# Patient Record
Sex: Male | Born: 1995 | Race: White | Hispanic: No | Marital: Single | Smoking: Current every day smoker
Health system: Southern US, Community
[De-identification: ages and names within clinical notes are randomized; demographics above are authoritative.]

## PROBLEM LIST (undated history)

## (undated) DIAGNOSIS — F988 Other specified behavioral and emotional disorders with onset usually occurring in childhood and adolescence: Secondary | ICD-10-CM

## (undated) DIAGNOSIS — L309 Dermatitis, unspecified: Secondary | ICD-10-CM

## (undated) DIAGNOSIS — F909 Attention-deficit hyperactivity disorder, unspecified type: Secondary | ICD-10-CM

## (undated) DIAGNOSIS — T7840XA Allergy, unspecified, initial encounter: Secondary | ICD-10-CM

## (undated) DIAGNOSIS — F419 Anxiety disorder, unspecified: Secondary | ICD-10-CM

## (undated) HISTORY — DX: Dermatitis, unspecified: L30.9

## (undated) HISTORY — DX: Other specified behavioral and emotional disorders with onset usually occurring in childhood and adolescence: F98.8

## (undated) HISTORY — DX: Allergy, unspecified, initial encounter: T78.40XA

## (undated) HISTORY — PX: DENTAL SURGERY: SHX609

## (undated) HISTORY — DX: Anxiety disorder, unspecified: F41.9

## (undated) HISTORY — DX: Attention-deficit hyperactivity disorder, unspecified type: F90.9

---
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
## 2017-01-04 DIAGNOSIS — F1220 Cannabis dependence, uncomplicated: Secondary | ICD-10-CM | POA: Diagnosis not present

## 2017-01-04 DIAGNOSIS — F1020 Alcohol dependence, uncomplicated: Secondary | ICD-10-CM | POA: Diagnosis not present

## 2017-01-11 DIAGNOSIS — F1220 Cannabis dependence, uncomplicated: Secondary | ICD-10-CM | POA: Diagnosis not present

## 2017-02-15 IMAGING — DX DG CERVICAL SPINE COMPLETE 4+V
6 series · 6 of 6 positions shown · non-contrast
Comparison: None in PACs

CLINICAL DATA: Stabbing pain in the right posterior neck
intermittently; patient lifts weights common no discrete episode of
injury.

EXAM:
CERVICAL SPINE - COMPLETE 4+ VIEW

[c-spine lat]
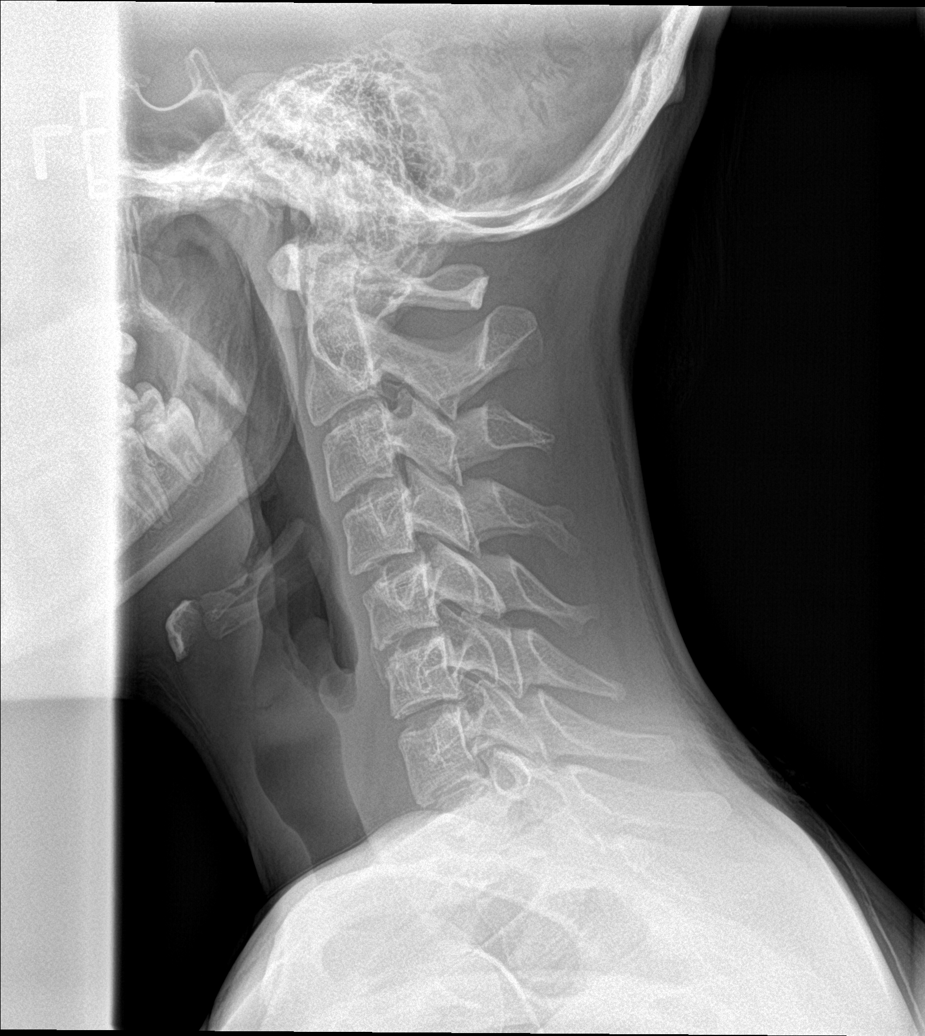

[c-spine obl (1 of 2)]
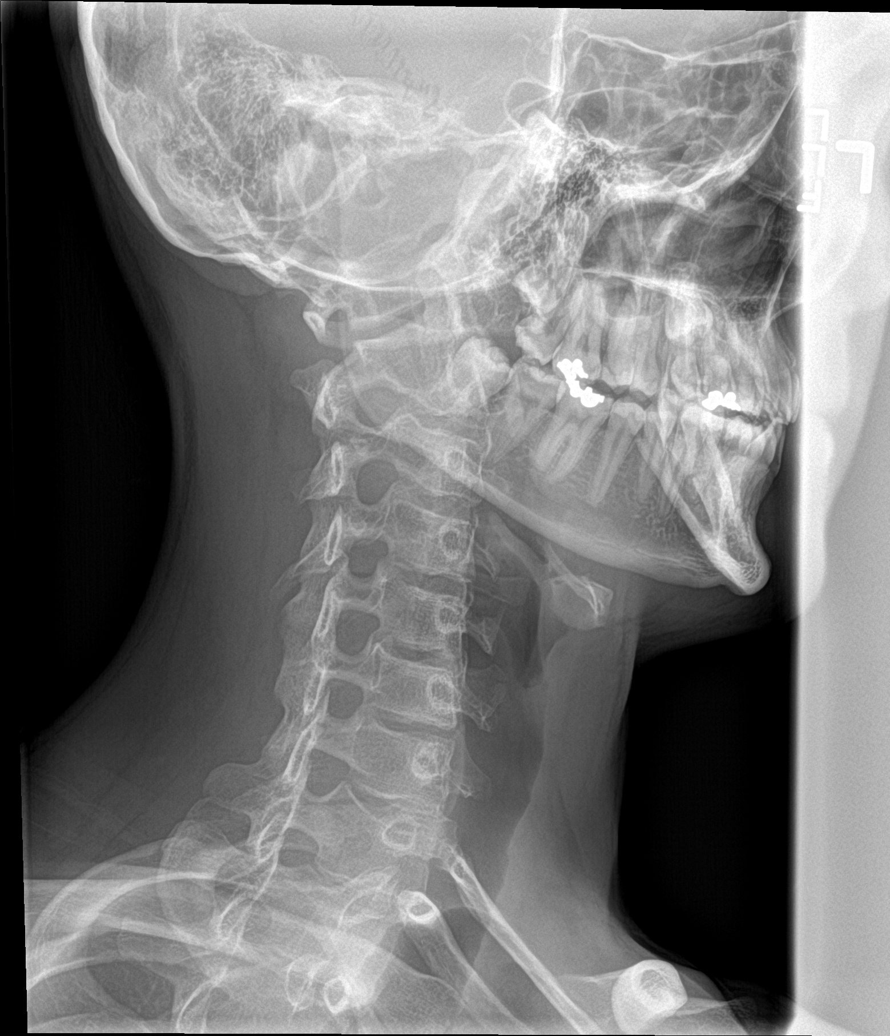

[c-spine obl (2 of 2)]
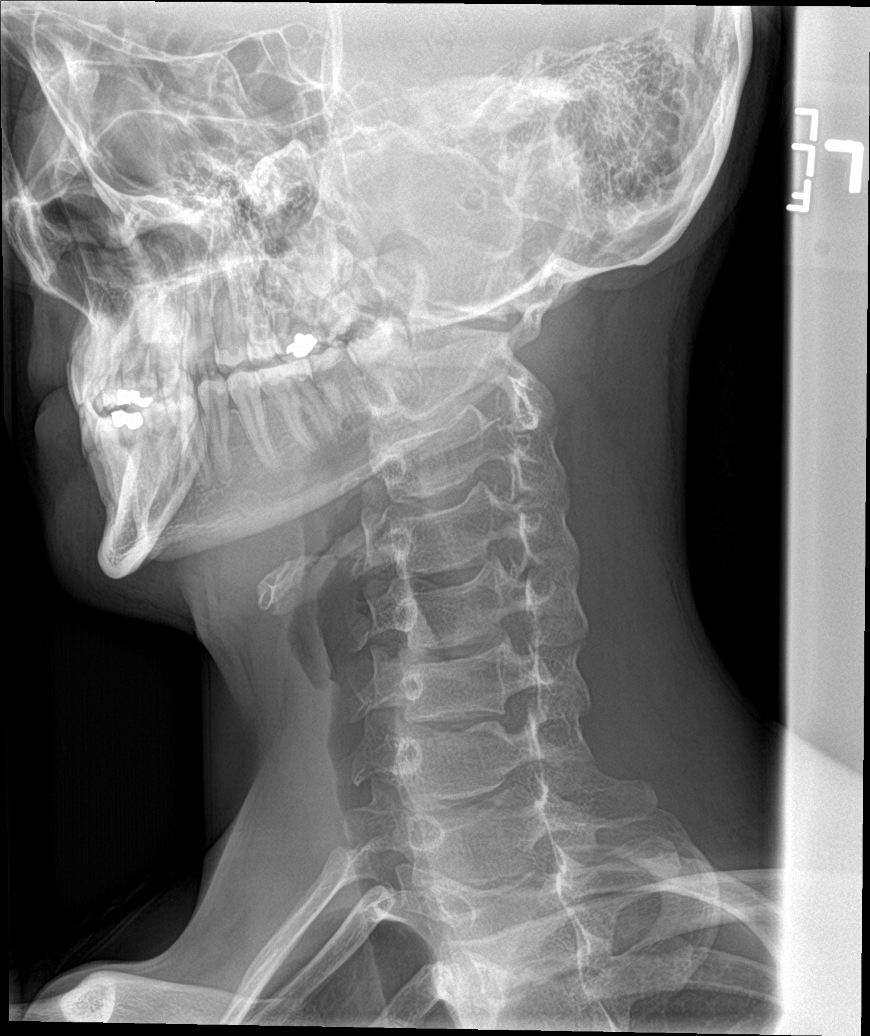

[c-spine ap]
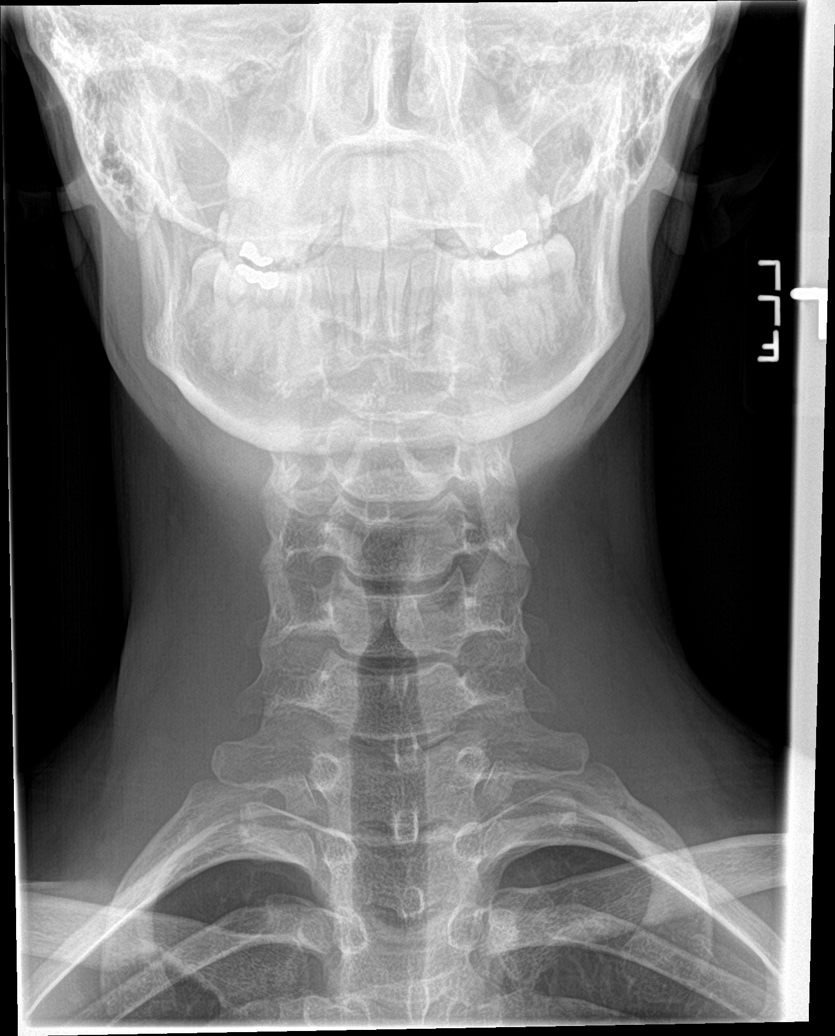

[c-spine open mouth]
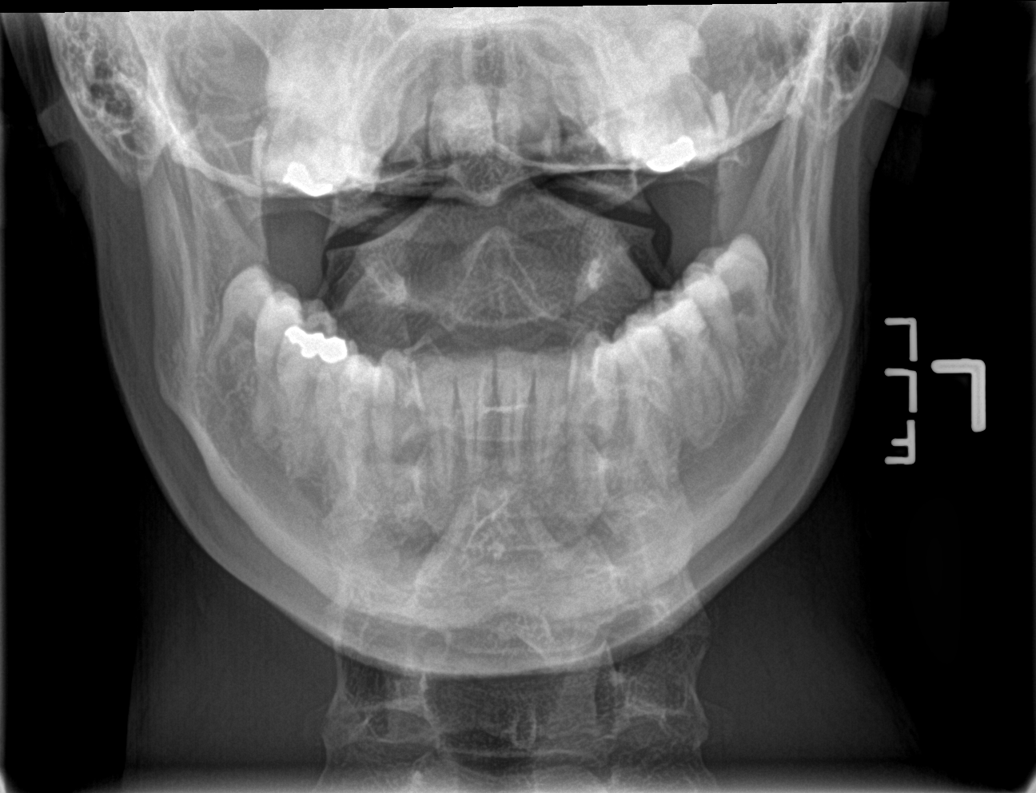

[[person_name]]
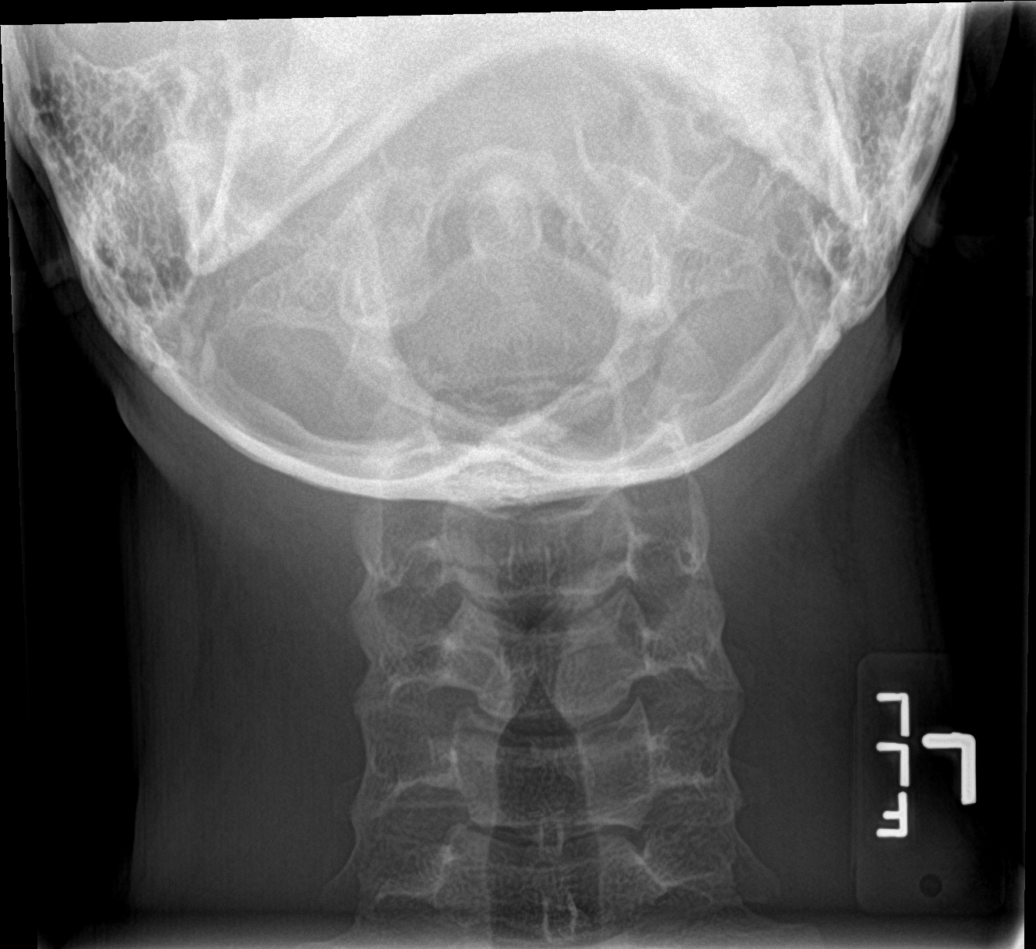

[6 of 6 positions shown; findings below may reference images not displayed]

FINDINGS: There is mild loss of the normal cervical lordosis. The cervical
vertebral bodies are preserved in height. There is minimal disc
space narrowing at C5-6. There is faint calcification in the
anterior longitudinal ligament at C5-6. There is no
spondylolisthesis. There is no perched facet or significant facet
joint hypertrophy. The spinous processes are intact. There is no
bony encroachment upon the neural foramina. The odontoid is intact.
The prevertebral soft tissue spaces are normal.
IMPRESSION: Mild degenerative disc disease centered at C5-6. There is no acute
bony abnormality.

## 2017-02-16 DIAGNOSIS — F1020 Alcohol dependence, uncomplicated: Secondary | ICD-10-CM | POA: Diagnosis not present

## 2017-02-17 DIAGNOSIS — F419 Anxiety disorder, unspecified: Secondary | ICD-10-CM | POA: Diagnosis not present

## 2017-02-17 DIAGNOSIS — F332 Major depressive disorder, recurrent severe without psychotic features: Secondary | ICD-10-CM | POA: Diagnosis not present

## 2017-02-17 DIAGNOSIS — F1410 Cocaine abuse, uncomplicated: Secondary | ICD-10-CM | POA: Diagnosis not present

## 2017-02-17 DIAGNOSIS — F1220 Cannabis dependence, uncomplicated: Secondary | ICD-10-CM | POA: Diagnosis not present

## 2017-02-17 DIAGNOSIS — F1020 Alcohol dependence, uncomplicated: Secondary | ICD-10-CM | POA: Diagnosis not present

## 2017-02-17 DIAGNOSIS — F909 Attention-deficit hyperactivity disorder, unspecified type: Secondary | ICD-10-CM | POA: Diagnosis not present

## 2017-02-18 DIAGNOSIS — F419 Anxiety disorder, unspecified: Secondary | ICD-10-CM | POA: Diagnosis not present

## 2017-02-18 DIAGNOSIS — F1410 Cocaine abuse, uncomplicated: Secondary | ICD-10-CM | POA: Diagnosis not present

## 2017-02-18 DIAGNOSIS — F332 Major depressive disorder, recurrent severe without psychotic features: Secondary | ICD-10-CM | POA: Diagnosis not present

## 2017-02-18 DIAGNOSIS — F1020 Alcohol dependence, uncomplicated: Secondary | ICD-10-CM | POA: Diagnosis not present

## 2017-02-18 DIAGNOSIS — F1220 Cannabis dependence, uncomplicated: Secondary | ICD-10-CM | POA: Diagnosis not present

## 2017-02-18 DIAGNOSIS — F909 Attention-deficit hyperactivity disorder, unspecified type: Secondary | ICD-10-CM | POA: Diagnosis not present

## 2017-02-19 DIAGNOSIS — F1020 Alcohol dependence, uncomplicated: Secondary | ICD-10-CM | POA: Diagnosis not present

## 2017-02-20 DIAGNOSIS — F1020 Alcohol dependence, uncomplicated: Secondary | ICD-10-CM | POA: Diagnosis not present

## 2017-02-21 DIAGNOSIS — F1020 Alcohol dependence, uncomplicated: Secondary | ICD-10-CM | POA: Diagnosis not present

## 2017-02-22 DIAGNOSIS — F1020 Alcohol dependence, uncomplicated: Secondary | ICD-10-CM | POA: Diagnosis not present

## 2017-02-22 DIAGNOSIS — F332 Major depressive disorder, recurrent severe without psychotic features: Secondary | ICD-10-CM | POA: Diagnosis not present

## 2017-02-22 DIAGNOSIS — F419 Anxiety disorder, unspecified: Secondary | ICD-10-CM | POA: Diagnosis not present

## 2017-02-22 DIAGNOSIS — F1220 Cannabis dependence, uncomplicated: Secondary | ICD-10-CM | POA: Diagnosis not present

## 2017-02-22 DIAGNOSIS — F1410 Cocaine abuse, uncomplicated: Secondary | ICD-10-CM | POA: Diagnosis not present

## 2017-02-22 DIAGNOSIS — F909 Attention-deficit hyperactivity disorder, unspecified type: Secondary | ICD-10-CM | POA: Diagnosis not present

## 2017-02-23 DIAGNOSIS — F1020 Alcohol dependence, uncomplicated: Secondary | ICD-10-CM | POA: Diagnosis not present

## 2017-02-24 DIAGNOSIS — F1020 Alcohol dependence, uncomplicated: Secondary | ICD-10-CM | POA: Diagnosis not present

## 2017-02-25 DIAGNOSIS — F1020 Alcohol dependence, uncomplicated: Secondary | ICD-10-CM | POA: Diagnosis not present

## 2017-02-26 DIAGNOSIS — F1020 Alcohol dependence, uncomplicated: Secondary | ICD-10-CM | POA: Diagnosis not present

## 2017-02-27 DIAGNOSIS — F1020 Alcohol dependence, uncomplicated: Secondary | ICD-10-CM | POA: Diagnosis not present

## 2017-02-28 DIAGNOSIS — F1410 Cocaine abuse, uncomplicated: Secondary | ICD-10-CM | POA: Diagnosis not present

## 2017-02-28 DIAGNOSIS — F909 Attention-deficit hyperactivity disorder, unspecified type: Secondary | ICD-10-CM | POA: Diagnosis not present

## 2017-02-28 DIAGNOSIS — F1220 Cannabis dependence, uncomplicated: Secondary | ICD-10-CM | POA: Diagnosis not present

## 2017-02-28 DIAGNOSIS — F419 Anxiety disorder, unspecified: Secondary | ICD-10-CM | POA: Diagnosis not present

## 2017-02-28 DIAGNOSIS — F1020 Alcohol dependence, uncomplicated: Secondary | ICD-10-CM | POA: Diagnosis not present

## 2017-02-28 DIAGNOSIS — F332 Major depressive disorder, recurrent severe without psychotic features: Secondary | ICD-10-CM | POA: Diagnosis not present

## 2017-03-01 DIAGNOSIS — F1020 Alcohol dependence, uncomplicated: Secondary | ICD-10-CM | POA: Diagnosis not present

## 2017-03-02 DIAGNOSIS — F1020 Alcohol dependence, uncomplicated: Secondary | ICD-10-CM | POA: Diagnosis not present

## 2017-03-03 DIAGNOSIS — F1220 Cannabis dependence, uncomplicated: Secondary | ICD-10-CM | POA: Diagnosis not present

## 2017-03-03 DIAGNOSIS — F419 Anxiety disorder, unspecified: Secondary | ICD-10-CM | POA: Diagnosis not present

## 2017-03-03 DIAGNOSIS — F909 Attention-deficit hyperactivity disorder, unspecified type: Secondary | ICD-10-CM | POA: Diagnosis not present

## 2017-03-03 DIAGNOSIS — F1410 Cocaine abuse, uncomplicated: Secondary | ICD-10-CM | POA: Diagnosis not present

## 2017-03-03 DIAGNOSIS — F332 Major depressive disorder, recurrent severe without psychotic features: Secondary | ICD-10-CM | POA: Diagnosis not present

## 2017-03-03 DIAGNOSIS — F1020 Alcohol dependence, uncomplicated: Secondary | ICD-10-CM | POA: Diagnosis not present

## 2017-03-04 DIAGNOSIS — F1020 Alcohol dependence, uncomplicated: Secondary | ICD-10-CM | POA: Diagnosis not present

## 2017-03-05 DIAGNOSIS — F1020 Alcohol dependence, uncomplicated: Secondary | ICD-10-CM | POA: Diagnosis not present

## 2017-03-06 DIAGNOSIS — F1020 Alcohol dependence, uncomplicated: Secondary | ICD-10-CM | POA: Diagnosis not present

## 2017-03-07 DIAGNOSIS — F1020 Alcohol dependence, uncomplicated: Secondary | ICD-10-CM | POA: Diagnosis not present

## 2017-03-07 DIAGNOSIS — F1410 Cocaine abuse, uncomplicated: Secondary | ICD-10-CM | POA: Diagnosis not present

## 2017-03-07 DIAGNOSIS — F1220 Cannabis dependence, uncomplicated: Secondary | ICD-10-CM | POA: Diagnosis not present

## 2017-03-07 DIAGNOSIS — F909 Attention-deficit hyperactivity disorder, unspecified type: Secondary | ICD-10-CM | POA: Diagnosis not present

## 2017-03-07 DIAGNOSIS — F419 Anxiety disorder, unspecified: Secondary | ICD-10-CM | POA: Diagnosis not present

## 2017-03-07 DIAGNOSIS — F332 Major depressive disorder, recurrent severe without psychotic features: Secondary | ICD-10-CM | POA: Diagnosis not present

## 2017-03-08 DIAGNOSIS — F1020 Alcohol dependence, uncomplicated: Secondary | ICD-10-CM | POA: Diagnosis not present

## 2017-03-09 DIAGNOSIS — F1020 Alcohol dependence, uncomplicated: Secondary | ICD-10-CM | POA: Diagnosis not present

## 2017-03-10 DIAGNOSIS — F1020 Alcohol dependence, uncomplicated: Secondary | ICD-10-CM | POA: Diagnosis not present

## 2017-03-11 DIAGNOSIS — F1020 Alcohol dependence, uncomplicated: Secondary | ICD-10-CM | POA: Diagnosis not present

## 2017-03-12 DIAGNOSIS — F1020 Alcohol dependence, uncomplicated: Secondary | ICD-10-CM | POA: Diagnosis not present

## 2017-03-13 DIAGNOSIS — F1020 Alcohol dependence, uncomplicated: Secondary | ICD-10-CM | POA: Diagnosis not present

## 2017-03-14 DIAGNOSIS — F909 Attention-deficit hyperactivity disorder, unspecified type: Secondary | ICD-10-CM | POA: Diagnosis not present

## 2017-03-14 DIAGNOSIS — F1410 Cocaine abuse, uncomplicated: Secondary | ICD-10-CM | POA: Diagnosis not present

## 2017-03-14 DIAGNOSIS — F1220 Cannabis dependence, uncomplicated: Secondary | ICD-10-CM | POA: Diagnosis not present

## 2017-03-14 DIAGNOSIS — F1020 Alcohol dependence, uncomplicated: Secondary | ICD-10-CM | POA: Diagnosis not present

## 2017-03-14 DIAGNOSIS — F419 Anxiety disorder, unspecified: Secondary | ICD-10-CM | POA: Diagnosis not present

## 2017-03-14 DIAGNOSIS — F332 Major depressive disorder, recurrent severe without psychotic features: Secondary | ICD-10-CM | POA: Diagnosis not present

## 2017-03-15 DIAGNOSIS — F1020 Alcohol dependence, uncomplicated: Secondary | ICD-10-CM | POA: Diagnosis not present

## 2017-03-15 DIAGNOSIS — F332 Major depressive disorder, recurrent severe without psychotic features: Secondary | ICD-10-CM | POA: Diagnosis not present

## 2017-03-15 DIAGNOSIS — F1410 Cocaine abuse, uncomplicated: Secondary | ICD-10-CM | POA: Diagnosis not present

## 2017-03-15 DIAGNOSIS — F419 Anxiety disorder, unspecified: Secondary | ICD-10-CM | POA: Diagnosis not present

## 2017-03-15 DIAGNOSIS — F909 Attention-deficit hyperactivity disorder, unspecified type: Secondary | ICD-10-CM | POA: Diagnosis not present

## 2017-03-15 DIAGNOSIS — F1220 Cannabis dependence, uncomplicated: Secondary | ICD-10-CM | POA: Diagnosis not present

## 2017-03-17 DIAGNOSIS — F1020 Alcohol dependence, uncomplicated: Secondary | ICD-10-CM | POA: Diagnosis not present

## 2017-03-21 DIAGNOSIS — F1020 Alcohol dependence, uncomplicated: Secondary | ICD-10-CM | POA: Diagnosis not present

## 2017-03-22 DIAGNOSIS — F1020 Alcohol dependence, uncomplicated: Secondary | ICD-10-CM | POA: Diagnosis not present

## 2017-03-28 DIAGNOSIS — F1020 Alcohol dependence, uncomplicated: Secondary | ICD-10-CM | POA: Diagnosis not present

## 2017-03-29 DIAGNOSIS — F1020 Alcohol dependence, uncomplicated: Secondary | ICD-10-CM | POA: Diagnosis not present

## 2017-03-31 DIAGNOSIS — F1020 Alcohol dependence, uncomplicated: Secondary | ICD-10-CM | POA: Diagnosis not present

## 2017-04-04 DIAGNOSIS — F1020 Alcohol dependence, uncomplicated: Secondary | ICD-10-CM | POA: Diagnosis not present

## 2017-04-05 DIAGNOSIS — F1020 Alcohol dependence, uncomplicated: Secondary | ICD-10-CM | POA: Diagnosis not present

## 2017-04-07 DIAGNOSIS — F1020 Alcohol dependence, uncomplicated: Secondary | ICD-10-CM | POA: Diagnosis not present

## 2017-04-12 DIAGNOSIS — F1020 Alcohol dependence, uncomplicated: Secondary | ICD-10-CM | POA: Diagnosis not present

## 2017-04-14 DIAGNOSIS — F1020 Alcohol dependence, uncomplicated: Secondary | ICD-10-CM | POA: Diagnosis not present

## 2017-04-18 DIAGNOSIS — F1020 Alcohol dependence, uncomplicated: Secondary | ICD-10-CM | POA: Diagnosis not present

## 2017-04-19 DIAGNOSIS — F1020 Alcohol dependence, uncomplicated: Secondary | ICD-10-CM | POA: Diagnosis not present

## 2017-04-21 DIAGNOSIS — F1410 Cocaine abuse, uncomplicated: Secondary | ICD-10-CM | POA: Diagnosis not present

## 2017-04-21 DIAGNOSIS — F1020 Alcohol dependence, uncomplicated: Secondary | ICD-10-CM | POA: Diagnosis not present

## 2017-04-21 DIAGNOSIS — F332 Major depressive disorder, recurrent severe without psychotic features: Secondary | ICD-10-CM | POA: Diagnosis not present

## 2017-04-21 DIAGNOSIS — F909 Attention-deficit hyperactivity disorder, unspecified type: Secondary | ICD-10-CM | POA: Diagnosis not present

## 2017-04-21 DIAGNOSIS — F1220 Cannabis dependence, uncomplicated: Secondary | ICD-10-CM | POA: Diagnosis not present

## 2017-04-21 DIAGNOSIS — F419 Anxiety disorder, unspecified: Secondary | ICD-10-CM | POA: Diagnosis not present

## 2017-04-28 DIAGNOSIS — F1020 Alcohol dependence, uncomplicated: Secondary | ICD-10-CM | POA: Diagnosis not present

## 2017-05-02 DIAGNOSIS — F1020 Alcohol dependence, uncomplicated: Secondary | ICD-10-CM | POA: Diagnosis not present

## 2017-05-03 DIAGNOSIS — F1020 Alcohol dependence, uncomplicated: Secondary | ICD-10-CM | POA: Diagnosis not present

## 2017-05-05 DIAGNOSIS — F1020 Alcohol dependence, uncomplicated: Secondary | ICD-10-CM | POA: Diagnosis not present

## 2017-05-09 DIAGNOSIS — F1020 Alcohol dependence, uncomplicated: Secondary | ICD-10-CM | POA: Diagnosis not present

## 2017-05-10 DIAGNOSIS — F1020 Alcohol dependence, uncomplicated: Secondary | ICD-10-CM | POA: Diagnosis not present

## 2017-05-24 ENCOUNTER — Encounter: Payer: Self-pay | Admitting: Family Medicine

## 2017-05-24 ENCOUNTER — Ambulatory Visit (INDEPENDENT_AMBULATORY_CARE_PROVIDER_SITE_OTHER): Payer: 59 | Admitting: Family Medicine

## 2017-05-24 VITALS — BP 118/72 | Ht 65.0 in | Wt 127.0 lb

## 2017-05-24 DIAGNOSIS — F329 Major depressive disorder, single episode, unspecified: Secondary | ICD-10-CM

## 2017-05-24 DIAGNOSIS — F1910 Other psychoactive substance abuse, uncomplicated: Secondary | ICD-10-CM | POA: Diagnosis not present

## 2017-05-24 DIAGNOSIS — F32A Depression, unspecified: Secondary | ICD-10-CM | POA: Insufficient documentation

## 2017-05-24 DIAGNOSIS — F419 Anxiety disorder, unspecified: Secondary | ICD-10-CM | POA: Diagnosis not present

## 2017-05-24 MED ORDER — HYDROXYZINE HCL 25 MG PO TABS: ORAL_TABLET | ORAL | 2 refills | Status: AC

## 2017-05-24 MED ORDER — TRAZODONE HCL 100 MG PO TABS: 100.0000 mg | ORAL_TABLET | Freq: Every day | ORAL | 2 refills | Status: DC

## 2017-05-24 MED ORDER — SERTRALINE HCL 100 MG PO TABS: 100.0000 mg | ORAL_TABLET | Freq: Every day | ORAL | 2 refills | Status: DC

## 2017-05-24 NOTE — Patient Instructions (Signed)
If struggling or feeling suicidal please follow up ASAP as we agreed  I will help set up a counselor  Also recheck here in 2 months

## 2017-05-24 NOTE — Progress Notes (Addendum)
   Subjective:    Patient ID: Michael Gray, male    DOB: 14-Dec-1995, 22 y.o.   MRN: [id number]  HPI Patient arrives to discuss anxiety and depression med. Patient is no longer seeing the psychologist and needs to have meds taken over here Patient does admit weeks ago he did feel like he was going to hurt himself but then he went through treatment after treatment he feels much better He does struggle some with anxiety and depression but he is trying to surround himself with better people and make better choices he does work full-time does not drive currently because he had a DUI His mom is here with him today He denies being suicidal.  He states he does not have access to a gun Review of Systems Denies chest tightness pressure pain shortness of breath denies high fever chills sweats    Objective:   Physical Exam  Lungs clear heart regular pulse normal vital signs stable      Assessment & Plan:  Substance abuse disorder-patient has gone through treatment [facility] at [facility] we have requested records await these  His underlying depression and anxiety doing well on sertraline and trazodone refills of this given may use hydroxyzine at nighttime to help with sleep if trazodone does not do enough  Patient was counseled that he ought to have a regular psychological counseling as well as have an accountability partner to stay away from alcohol he was also counseled total abstinence not to dabble with it but  Patient denied being suicidal.  Patient agrees in front of his mother that if he starts feeling suicidal or feeling like he is going to harm himself he will immediately seek help he will follow-up here immediately  Recheck patient in 2-3 months  I did inform the patient and the mother that sometimes medication can actually make depression worse and increased risk of being suicidal.  Although it is reasonable for him to be on medication I informed the patient that if he feels his  depression is worsening or if he feels suicidal he needs to immediately seek out help with us or psychologist/ER  Initially I had the patient coming back in 2 months but now I plan to bring him back in 1 month we will notify the patient

## 2017-05-25 NOTE — Addendum Note (Signed)
Addended by: Margaretha SheffieldBROWN, AUTUMN S on: 05/25/2017 08:30 AM   Modules accepted: Orders

## 2017-05-25 NOTE — Progress Notes (Signed)
I attempted to call Michael Gray, he answered and asked that I call him back after 3:00 because he is at work.

## 2017-05-25 NOTE — Progress Notes (Signed)
Referral ordered in Epic. 

## 2017-05-26 ENCOUNTER — Telehealth: Payer: Self-pay | Admitting: Family Medicine

## 2017-05-26 NOTE — Telephone Encounter (Signed)
[facility] - Need to change appt from March to February per Dr. Lorin PicketScott - see note below  Message  Received: 2 days ago  Message Contents  Luking, Jonna CoupScott A, MD  P [facility] Admin Pool        I would like for this patient to follow up in 1 month rather than 2 months-please contact him to move office visit to 4-5 weeks from now thanks

## 2017-05-27 ENCOUNTER — Encounter: Payer: Self-pay | Admitting: Family Medicine

## 2017-06-23 ENCOUNTER — Ambulatory Visit: Payer: 59 | Admitting: Family Medicine

## 2017-07-12 ENCOUNTER — Encounter: Payer: Self-pay | Admitting: Family Medicine

## 2017-07-26 ENCOUNTER — Ambulatory Visit: Payer: 59 | Admitting: Family Medicine

## 2017-07-27 ENCOUNTER — Ambulatory Visit: Payer: 59 | Admitting: Family Medicine

## 2017-08-02 ENCOUNTER — Ambulatory Visit (INDEPENDENT_AMBULATORY_CARE_PROVIDER_SITE_OTHER): Payer: 59 | Admitting: Family Medicine

## 2017-08-02 ENCOUNTER — Encounter: Payer: Self-pay | Admitting: Family Medicine

## 2017-08-02 VITALS — BP 118/78 | Ht 65.0 in | Wt 125.0 lb

## 2017-08-02 DIAGNOSIS — G4700 Insomnia, unspecified: Secondary | ICD-10-CM

## 2017-08-02 DIAGNOSIS — F439 Reaction to severe stress, unspecified: Secondary | ICD-10-CM

## 2017-08-02 MED ORDER — TRAZODONE HCL 100 MG PO TABS: ORAL_TABLET | ORAL | 1 refills | Status: DC

## 2017-08-02 NOTE — Progress Notes (Signed)
   Subjective:    Patient ID: Michael Gray, male    DOB: [birth year], 22 y.o.   MRN: [id number]  HPI Patient is here today to follow up on Anxiety. He state he thinks it is better to some extent. He states he takes his zoloft sporadically.States he does take the trazodone regularly.  Patient states he is working gets along with coworkers denies any problems denies any fights denies drug use states only occasionally using alcohol keeping it under control patient understands that he should avoid alcohol altogether patient denies being depressed denies being suicidal homicidal  Review of Systems  Constitutional: Negative for activity change.  HENT: Negative for congestion and rhinorrhea.   Respiratory: Negative for cough and shortness of breath.   Cardiovascular: Negative for chest pain.  Gastrointestinal: Negative for abdominal pain, diarrhea, nausea and vomiting.  Genitourinary: Negative for dysuria and hematuria.  Neurological: Negative for weakness and headaches.  Psychiatric/Behavioral: Negative for confusion.       Objective:   Physical Exam  Constitutional: He appears well-nourished.  Cardiovascular: Normal rate, regular rhythm and normal heart sounds.  No murmur heard. Pulmonary/Chest: Effort normal and breath sounds normal.  Musculoskeletal: He exhibits no edema.  Lymphadenopathy:    He has no cervical adenopathy.  Neurological: He is alert.  Psychiatric: His behavior is normal.  Vitals reviewed.  Patient states he hopes to get his driver's license back by January 2020       Assessment & Plan:  I do not feel it is beneficial for the patient to take Zoloft any longer I told him to stop this medicine he may continue to use trazodone to help with sleep at night he states it helps some but he is requesting a higher dose we will increase it to 1-1/2 tablets each evening he is to follow-up if progressive troubles  Recheck the patient in 6 months

## 2017-12-05 DIAGNOSIS — S62356D Nondisplaced fracture of shaft of fifth metacarpal bone, right hand, subsequent encounter for fracture with routine healing: Secondary | ICD-10-CM | POA: Diagnosis not present

## 2017-12-05 DIAGNOSIS — S62306A Unspecified fracture of fifth metacarpal bone, right hand, initial encounter for closed fracture: Secondary | ICD-10-CM | POA: Diagnosis not present

## 2017-12-19 DIAGNOSIS — S62356D Nondisplaced fracture of shaft of fifth metacarpal bone, right hand, subsequent encounter for fracture with routine healing: Secondary | ICD-10-CM | POA: Diagnosis not present

## 2018-01-16 DIAGNOSIS — S62356D Nondisplaced fracture of shaft of fifth metacarpal bone, right hand, subsequent encounter for fracture with routine healing: Secondary | ICD-10-CM | POA: Diagnosis not present

## 2018-06-15 ENCOUNTER — Encounter: Payer: Self-pay | Admitting: Family Medicine

## 2018-06-15 ENCOUNTER — Ambulatory Visit (INDEPENDENT_AMBULATORY_CARE_PROVIDER_SITE_OTHER): Payer: 59 | Admitting: Family Medicine

## 2018-06-15 VITALS — Temp 99.1°F | Wt 127.0 lb

## 2018-06-15 DIAGNOSIS — H65111 Acute and subacute allergic otitis media (mucoid) (sanguinous) (serous), right ear: Secondary | ICD-10-CM

## 2018-06-15 DIAGNOSIS — J189 Pneumonia, unspecified organism: Secondary | ICD-10-CM

## 2018-06-15 DIAGNOSIS — J181 Lobar pneumonia, unspecified organism: Secondary | ICD-10-CM

## 2018-06-15 DIAGNOSIS — J111 Influenza due to unidentified influenza virus with other respiratory manifestations: Secondary | ICD-10-CM

## 2018-06-15 MED ORDER — AZITHROMYCIN 250 MG PO TABS: ORAL_TABLET | ORAL | 0 refills | Status: DC

## 2018-06-15 NOTE — Progress Notes (Signed)
   Subjective:    Patient ID: Michael Gray, male    DOB: 03-10-1996, 23 y.o.   MRN: [id number]  Fever   This is a new problem. The current episode started in the past 7 days. The maximum temperature noted was 100 to 100.9 F. Associated symptoms include congestion, coughing, a sore throat and wheezing. Pertinent negatives include no chest pain, ear pain, nausea or vomiting. Associated symptoms comments: Runny nose. Treatments tried: mucinex. The treatment provided mild relief.   Patient with significant coughing congestion denies  wheezing difficulty breathing Patient was some fever over the past few days and sweats at nighttime relates low energy   Review of Systems  Constitutional: Positive for fever. Negative for activity change and chills.  HENT: Positive for congestion, rhinorrhea and sore throat. Negative for ear pain.   Eyes: Negative for discharge.  Respiratory: Positive for cough and wheezing.   Cardiovascular: Negative for chest pain.  Gastrointestinal: Negative for nausea and vomiting.  Musculoskeletal: Negative for arthralgias.       Objective:   Physical Exam Vitals signs and nursing note reviewed.  Constitutional:      Appearance: He is well-developed.  HENT:     Head: Normocephalic.     Mouth/Throat:     Pharynx: No oropharyngeal exudate.  Neck:     Musculoskeletal: Normal range of motion.  Cardiovascular:     Rate and Rhythm: Normal rate and regular rhythm.     Heart sounds: Normal heart sounds. No murmur.  Pulmonary:     Effort: Pulmonary effort is normal.     Breath sounds: No wheezing.  Lymphadenopathy:     Cervical: No cervical adenopathy.  Skin:    General: Skin is warm and dry.  Neurological:     Motor: No abnormal muscle tone.   Crackles in the left base but not respiratory distress Early right otitis media       Assessment & Plan:  Viral syndrome Flu He is past the point where Tamiflu would help Z-Pak for early pneumonia Early right  otitis media Should gradually get better over the next several days Warning signs discussed To lay low over the next few days Encourage patient to quit smoking

## 2018-06-15 NOTE — Patient Instructions (Signed)
Steps to Quit Smoking  Smoking tobacco can be bad for your health. It can also affect almost every organ in your body. Smoking puts you and people around you at risk for many serious long-lasting (chronic) diseases. Quitting smoking is hard, but it is one of the best things that you can do for your health. It is never too late to quit. What are the benefits of quitting smoking? When you quit smoking, you lower your risk for getting serious diseases and conditions. They can include:  Lung cancer or lung disease.  Heart disease.  Stroke.  Heart attack.  Not being able to have children (infertility).  Weak bones (osteoporosis) and broken bones (fractures). If you have coughing, wheezing, and shortness of breath, those symptoms may get better when you quit. You may also get sick less often. If you are pregnant, quitting smoking can help to lower your chances of having a baby of low birth weight. What can I do to help me quit smoking? Talk with your doctor about what can help you quit smoking. Some things you can do (strategies) include:  Quitting smoking totally, instead of slowly cutting back how much you smoke over a period of time.  Going to in-person counseling. You are more likely to quit if you go to many counseling sessions.  Using resources and support systems, such as: ? Online chats with a counselor. ? Phone quitlines. ? Printed self-help materials. ? Support groups or group counseling. ? Text messaging programs. ? Mobile phone apps or applications.  Taking medicines. Some of these medicines may have nicotine in them. If you are pregnant or breastfeeding, do not take any medicines to quit smoking unless your doctor says it is okay. Talk with your doctor about counseling or other things that can help you. Talk with your doctor about using more than one strategy at the same time, such as taking medicines while you are also going to in-person counseling. This can help make  quitting easier. What things can I do to make it easier to quit? Quitting smoking might feel very hard at first, but there is a lot that you can do to make it easier. Take these steps:  Talk to your family and friends. Ask them to support and encourage you.  Call phone quitlines, reach out to support groups, or work with a counselor.  Ask people who smoke to not smoke around you.  Avoid places that make you want (trigger) to smoke, such as: ? Bars. ? Parties. ? Smoke-break areas at work.  Spend time with people who do not smoke.  Lower the stress in your life. Stress can make you want to smoke. Try these things to help your stress: ? Getting regular exercise. ? Deep-breathing exercises. ? Yoga. ? Meditating. ? Doing a body scan. To do this, close your eyes, focus on one area of your body at a time from head to toe, and notice which parts of your body are tense. Try to relax the muscles in those areas.  Download or buy apps on your mobile phone or tablet that can help you stick to your quit plan. There are many free apps, such as QuitGuide from the CDC (Centers for Disease Control and Prevention). You can find more support from smokefree.gov and other websites. This information is not intended to replace advice given to you by your health care provider. Make sure you discuss any questions you have with your health care provider. Document Released: 02/13/2009 Document Revised: 12/16/2015   Document Reviewed: 09/03/2014 Elsevier Interactive Patient Education  2019 Elsevier Inc. Community-Acquired Pneumonia, Adult Pneumonia is an infection of the lungs. It causes swelling in the airways of the lungs. Mucus and fluid may also build up inside the airways. One type of pneumonia can happen while a person is in a hospital. A different type can happen when a person is not in a hospital (community-acquired pneumonia).  What are the causes?  This condition is caused by germs (viruses, bacteria,  or fungi). Some types of germs can be passed from one person to another. This can happen when you breathe in droplets from the cough or sneeze of an infected person. What increases the risk? You are more likely to develop this condition if you:  Have a long-term (chronic) disease, such as: ? Chronic obstructive pulmonary disease (COPD). ? Asthma. ? Cystic fibrosis. ? Congestive heart failure. ? Diabetes. ? Kidney disease.  Have HIV.  Have sickle cell disease.  Have had your spleen removed.  Do not take good care of your teeth and mouth (poor dental hygiene).  Have a medical condition that increases the risk of breathing in droplets from your own mouth and nose.  Have a weakened body defense system (immune system).  Are a smoker.  Travel to areas where the germs that cause this illness are common.  Are around certain animals or the places they live. What are the signs or symptoms?  A dry cough.  A wet (productive) cough.  Fever.  Sweating.  Chest pain. This often happens when breathing deeply or coughing.  Fast breathing or trouble breathing.  Shortness of breath.  Shaking chills.  Feeling tired (fatigue).  Muscle aches. How is this treated? Treatment for this condition depends on many things. Most adults can be treated at home. In some cases, treatment must happen in a hospital. Treatment may include:  Medicines given by mouth or through an IV tube.  Being given extra oxygen.  Respiratory therapy. In rare cases, treatment for very bad pneumonia may include:  Using a machine to help you breathe.  Having a procedure to remove fluid from around your lungs. Follow these instructions at home: Medicines  Take over-the-counter and prescription medicines only as told by your doctor. ? Only take cough medicine if you are losing sleep.  If you were prescribed an antibiotic medicine, take it as told by your doctor. Do not stop taking the antibiotic even if  you start to feel better. General instructions   Sleep with your head and neck raised (elevated). You can do this by sleeping in a recliner or by putting a few pillows under your head.  Rest as needed. Get at least 8 hours of sleep each night.  Drink enough water to keep your pee (urine) pale yellow.  Eat a healthy diet that includes plenty of vegetables, fruits, whole grains, low-fat dairy products, and lean protein.  Do not use any products that contain nicotine or tobacco. These include cigarettes, e-cigarettes, and chewing tobacco. If you need help quitting, ask your doctor.  Keep all follow-up visits as told by your doctor. This is important. How is this prevented? A shot (vaccine) can help prevent pneumonia. Shots are often suggested for:  People older than 21 years of age.  People older than 21 years of age who: ? Are having cancer treatment. ? Have long-term (chronic) lung disease. ? Have problems with their body's defense system. You may also prevent pneumonia if you take these actions:  Get  the flu (influenza) shot every year.  Go to the dentist as often as told.  Wash your hands often. If you cannot use soap and water, use hand sanitizer. Contact a doctor if:  You have a fever.  You lose sleep because your cough medicine does not help. Get help right away if:  You are short of breath and it gets worse.  You have more chest pain.  Your sickness gets worse. This is very serious if: ? You are an older adult. ? Your body's defense system is weak.  You cough up blood. Summary  Pneumonia is an infection of the lungs.  Most adults can be treated at home. Some will need treatment in a hospital.  Drink enough water to keep your pee pale yellow.  Get at least 8 hours of sleep each night. This information is not intended to replace advice given to you by your health care provider. Make sure you discuss any questions you have with your health care  provider. Document Released: 10/06/2007 Document Revised: 12/15/2017 Document Reviewed: 12/15/2017 Elsevier Interactive Patient Education  2019 Elsevier Inc. Influenza, Adult Influenza is also called "the flu." It is an infection in the lungs, nose, and throat (respiratory tract). It is caused by a virus. The flu causes symptoms that are similar to symptoms of a cold. It also causes a high fever and body aches. The flu spreads easily from person to person (is contagious). Getting a flu shot (influenza vaccination) every year is the best way to prevent the flu. What are the causes? This condition is caused by the influenza virus. You can get the virus by:  Breathing in droplets that are in the air from the cough or sneeze of a person who has the virus.  Touching something that has the virus on it (is contaminated) and then touching your mouth, nose, or eyes. What increases the risk? Certain things may make you more likely to get the flu. These include:  Not washing your hands often.  Having close contact with many people during cold and flu season.  Touching your mouth, eyes, or nose without first washing your hands.  Not getting a flu shot every year. You may have a higher risk for the flu, along with serious problems such as a lung infection (pneumonia), if you:  Are older than 65.  Are pregnant.  Have a weakened disease-fighting system (immune system) because of a disease or taking certain medicines.  Have a long-term (chronic) illness, such as: ? Heart, kidney, or lung disease. ? Diabetes. ? Asthma.  Have a liver disorder.  Are very overweight (morbidly obese).  Have anemia. This is a condition that affects your red blood cells. What are the signs or symptoms? Symptoms usually begin suddenly and last 4-14 days. They may include:  Fever and chills.  Headaches, body aches, or muscle aches.  Sore throat.  Cough.  Runny or stuffy (congested) nose.  Chest  discomfort.  Not wanting to eat as much as normal (poor appetite).  Weakness or feeling tired (fatigue).  Dizziness.  Feeling sick to your stomach (nauseous) or throwing up (vomiting). How is this treated? If the flu is found early, you can be treated with medicine that can help reduce how bad the illness is and how long it lasts (antiviral medicine). This may be given by mouth (orally) or through an IV tube. Taking care of yourself at home can help your symptoms get better. Your doctor may suggest:  Taking over-the-counter medicines.  Drinking plenty of fluids. The flu often goes away on its own. If you have very bad symptoms or other problems, you may be treated in a hospital. Follow these instructions at home:     Activity  Rest as needed. Get plenty of sleep.  Stay home from work or school as told by your doctor. ? Do not leave home until you do not have a fever for 24 hours without taking medicine. ? Leave home only to visit your doctor. Eating and drinking  Take an ORS (oral rehydration solution). This is a drink that is sold at pharmacies and stores.  Drink enough fluid to keep your pee (urine) pale yellow.  Drink clear fluids in small amounts as you are able. Clear fluids include: ? Water. ? Ice chips. ? Fruit juice that has water added (diluted fruit juice). ? Low-calorie sports drinks.  Eat bland, easy-to-digest foods in small amounts as you are able. These foods include: ? Bananas. ? Applesauce. ? Rice. ? Lean meats. ? Toast. ? Crackers.  Do not eat or drink: ? Fluids that have a lot of sugar or caffeine. ? Alcohol. ? Spicy or fatty foods. General instructions  Take over-the-counter and prescription medicines only as told by your doctor.  Use a cool mist humidifier to add moisture to the air in your home. This can make it easier for you to breathe.  Cover your mouth and nose when you cough or sneeze.  Wash your hands with soap and water often,  especially after you cough or sneeze. If you cannot use soap and water, use alcohol-based hand sanitizer.  Keep all follow-up visits as told by your doctor. This is important. How is this prevented?   Get a flu shot every year. You may get the flu shot in late summer, fall, or winter. Ask your doctor when you should get your flu shot.  Avoid contact with people who are sick during fall and winter (cold and flu season). Contact a doctor if:  You get new symptoms.  You have: ? Chest pain. ? Watery poop (diarrhea). ? A fever.  Your cough gets worse.  You start to have more mucus.  You feel sick to your stomach.  You throw up. Get help right away if you:  Have shortness of breath.  Have trouble breathing.  Have skin or nails that turn a bluish color.  Have very bad pain or stiffness in your neck.  Get a sudden headache.  Get sudden pain in your face or ear.  Cannot eat or drink without throwing up. Summary  Influenza ("the flu") is an infection in the lungs, nose, and throat. It is caused by a virus.  Take over-the-counter and prescription medicines only as told by your doctor.  Getting a flu shot every year is the best way to avoid getting the flu. This information is not intended to replace advice given to you by your health care provider. Make sure you discuss any questions you have with your health care provider. Document Released: 01/27/2008 Document Revised: 10/05/2017 Document Reviewed: 10/05/2017 Elsevier Interactive Patient Education  2019 [employer].

## 2018-10-25 ENCOUNTER — Encounter: Payer: Self-pay | Admitting: Family Medicine

## 2018-10-25 ENCOUNTER — Ambulatory Visit (INDEPENDENT_AMBULATORY_CARE_PROVIDER_SITE_OTHER): Payer: 59 | Admitting: Family Medicine

## 2018-10-25 ENCOUNTER — Other Ambulatory Visit: Payer: Self-pay

## 2018-10-25 DIAGNOSIS — F411 Generalized anxiety disorder: Secondary | ICD-10-CM | POA: Diagnosis not present

## 2018-10-25 HISTORY — DX: Generalized anxiety disorder: F41.1

## 2018-10-25 MED ORDER — SERTRALINE HCL 100 MG PO TABS: 100.0000 mg | ORAL_TABLET | Freq: Every day | ORAL | 2 refills | Status: DC

## 2018-10-25 NOTE — Progress Notes (Signed)
   Subjective:    Patient ID: Michael Gray, male    DOB: Jun 24, 1995, 23 y.o.   MRN: [id number]  HPIAnxiety and depression. Would like to get back on sertraline 100mg . Has been off for about 6 months. phq9 and gad 7 completed.  Significant stress and anxiety related issues denies being suicidal.  Relates he feels stressed out at times.  He states he was doing better when he was taking sertraline months ago.  He stopped taking it for unknown reason.  Became lost to follow-up until recently.  Denies drug abuse issues.  Having some work stress related issues.  Denies being suicidal or homicidal.    Office Visit from 10/25/2018 in [place]  PHQ-9 Total Score  5     GAD 7 : Generalized Anxiety Score 10/25/2018  Nervous, Anxious, on Edge 2  Control/stop worrying 1  Worry too much - different things 0  Trouble relaxing 0  Restless 3  Easily annoyed or irritable 3  Afraid - awful might happen 0  Total GAD 7 Score 9     Virtual Visit via Video Note  I connected with [name] Overall on 10/25/18 at  3:00 PM EDT by a video enabled telemedicine application and verified that I am speaking with the correct person using two identifiers.  Location: Patient: home Provider: office   I discussed the limitations of evaluation and management by telemedicine and the availability of in person appointments. The patient expressed understanding and agreed to proceed.  History of Present Illness:    Observations/Objective:   Assessment and Plan:   Follow Up Instructions:    I discussed the assessment and treatment plan with the patient. The patient was provided an opportunity to ask questions and all were answered. The patient agreed with the plan and demonstrated an understanding of the instructions.   The patient was advised to call back or seek an in-person evaluation if the symptoms worsen or if the condition fails to improve as anticipated.  I provided 15 minutes of  non-face-to-face time during this encounter.      Review of Systems  Constitutional: Negative for activity change, fatigue and fever.  HENT: Negative for congestion and rhinorrhea.   Respiratory: Negative for cough and shortness of breath.   Cardiovascular: Negative for chest pain and leg swelling.  Gastrointestinal: Negative for abdominal pain, diarrhea and nausea.  Genitourinary: Negative for dysuria and hematuria.  Neurological: Negative for weakness and headaches.  Psychiatric/Behavioral: Negative for agitation and behavioral problems.       Objective:   Physical Exam  Patient had virtual visit Appears to be in no distress Atraumatic Neuro able to relate and oriented No apparent resp distress Color normal       Assessment & Plan:  Mild depression symptoms Stress related issues Mild generalized anxiety disorder Is not having any type of depression related issues to the degree of being suicidal  The patient also is interested in starting the sertraline I did offer counseling but he will consider this. I did counsel the patient that this medication in some cases can sometimes make depression anxiety worse and if he starts feeling that way to immediately stop it I also counseled him that sometimes this medication can cause a person to start becoming suicidal and the thoughts if that occurs stop the medicine seek help immediately here or ER  Follow-up in 2 weeks

## 2019-04-23 ENCOUNTER — Other Ambulatory Visit: Payer: Self-pay

## 2019-04-23 ENCOUNTER — Ambulatory Visit: Payer: HRSA Program | Attending: Internal Medicine

## 2019-04-23 DIAGNOSIS — Z20822 Contact with and (suspected) exposure to covid-19: Secondary | ICD-10-CM

## 2019-04-23 DIAGNOSIS — Z20828 Contact with and (suspected) exposure to other viral communicable diseases: Secondary | ICD-10-CM | POA: Insufficient documentation

## 2019-04-24 LAB — NOVEL CORONAVIRUS, NAA: SARS-CoV-2, NAA: NOT DETECTED

## 2020-01-14 ENCOUNTER — Telehealth: Payer: Self-pay | Admitting: Family Medicine

## 2020-01-14 DIAGNOSIS — F339 Major depressive disorder, recurrent, unspecified: Secondary | ICD-10-CM

## 2020-01-14 NOTE — Telephone Encounter (Signed)
Gave pt info for walk in clinic and also advised ER if feeling sucidial. Referral put in and appt scheduled this week with karen on 9/17

## 2020-01-14 NOTE — Telephone Encounter (Signed)
Patient called & left a voicemail requesting a referral to Dr. Vanetta Shawl (psych)  Called & asked pt what he needed referral for & he states depression  If OK to refer, please initiate referral in system so that it may be processed

## 2020-01-14 NOTE — Telephone Encounter (Signed)
Called pt and asked if he was having any sucidal thoughts and he states he does have those thoughts from time to time but does not feel like he would act on it. States he does not have a plan to hurt himseIf gave pt the numbers to 3 walk in clinics that could help him if he felt he needed help today or felt as if he wanted to hurt himself. [facility], [facility], and [facility] and told him I would send his message to dr scott about referral he is requesting.

## 2020-01-14 NOTE — Telephone Encounter (Signed)
1.  May have referral 2.  Make sure that he has information regarding the [place] walk-in assessment if he starts feeling suicidal immediately should go #3 please offer her an appointment this week either with myself or with Clydie Braun

## 2020-01-15 ENCOUNTER — Encounter: Payer: Self-pay | Admitting: Family Medicine

## 2020-01-18 ENCOUNTER — Other Ambulatory Visit: Payer: Self-pay

## 2020-01-18 ENCOUNTER — Encounter: Payer: BLUE CROSS/BLUE SHIELD | Admitting: Family Medicine

## 2020-02-19 ENCOUNTER — Telehealth: Payer: Self-pay

## 2020-02-19 NOTE — Telephone Encounter (Signed)
Nurses may go ahead with referral due to mental health issues-nurses please call the patient clarify with patient reason for him to see mental health then go ahead with the new referral for psychiatry thank you

## 2020-02-19 NOTE — Telephone Encounter (Signed)
Patient is wanting to been sent to different mental health doctor due to the one he is seeing is leaving and was told to get in contact with our office to get another appointment

## 2020-02-19 NOTE — Telephone Encounter (Signed)
Lmtc

## 2020-02-21 ENCOUNTER — Other Ambulatory Visit: Payer: Self-pay | Admitting: *Deleted

## 2020-02-21 DIAGNOSIS — F32A Depression, unspecified: Secondary | ICD-10-CM

## 2020-02-21 DIAGNOSIS — F419 Anxiety disorder, unspecified: Secondary | ICD-10-CM

## 2020-02-21 NOTE — Telephone Encounter (Signed)
Pt wants to be referred due to anxiety and depression. He does NOT want to see dr Tenny Craw. He is also not having any problems at this time. No sucidial thoughts. Just needs referral since his provider is leaving. Referral placed.

## 2020-10-20 ENCOUNTER — Encounter: Payer: Self-pay | Admitting: Emergency Medicine

## 2020-10-20 ENCOUNTER — Other Ambulatory Visit: Payer: Self-pay

## 2020-10-20 ENCOUNTER — Ambulatory Visit
Admission: EM | Admit: 2020-10-20 | Discharge: 2020-10-20 | Disposition: A | Discharge status: Home / Self Care | Payer: 59 | Attending: Family Medicine | Admitting: Family Medicine

## 2020-10-20 DIAGNOSIS — J069 Acute upper respiratory infection, unspecified: Secondary | ICD-10-CM

## 2020-10-20 MED ORDER — PSEUDOEPHEDRINE HCL ER 120 MG PO TB12: 120.0000 mg | ORAL_TABLET | Freq: Two times a day (BID) | ORAL | 0 refills | Status: DC

## 2020-10-20 MED ORDER — LEVOCETIRIZINE DIHYDROCHLORIDE 5 MG PO TABS: 5.0000 mg | ORAL_TABLET | Freq: Every evening | ORAL | 0 refills | Status: AC

## 2020-10-20 MED ORDER — PROMETHAZINE-DM 6.25-15 MG/5ML PO SYRP: 5.0000 mL | ORAL_SOLUTION | Freq: Four times a day (QID) | ORAL | 0 refills | Status: DC | PRN

## 2020-10-20 NOTE — ED Triage Notes (Signed)
Sinus congestion, cough, eyes are red, cold chills x 3 days.

## 2020-10-20 NOTE — ED Provider Notes (Signed)
[facility]    CSN: [id number] Arrival date & time: 10/20/20  1620      History   Chief Complaint No chief complaint on file.   HPI Michael Gray is a 25 y.o. male.   HPI Patient presents with URI symptoms including cough, sore throat, otalgia, nasal congestion, runny nose, and sinus pressure.Denies worrisome symptoms of shortness of breath, weakness, N&V, chest pain. Declines covid /flu.   Past Medical History:  Diagnosis Date   ADD (attention deficit disorder)    ADHD (attention deficit hyperactivity disorder)    Allergy    Anxiety    Eczema    Generalized anxiety disorder 10/25/2018    Patient Active Problem List   Diagnosis Date Noted   Generalized anxiety disorder 10/25/2018   Substance abuse (HCC) 05/24/2017   Anxiety and depression 05/24/2017   Outbursts of anger 02/26/2016   ADD (attention deficit disorder) 12/06/2012    Past Surgical History:  Procedure Laterality Date   DENTAL SURGERY  appox 2007       Home Medications    Prior to Admission medications   Medication Sig Start Date End Date Taking? Authorizing Provider  levocetirizine (XYZAL) 5 MG tablet Take 1 tablet (5 mg total) by mouth every evening. 10/20/20  Yes Bing Neighbors, FNP  promethazine-dextromethorphan (PROMETHAZINE-DM) 6.25-15 MG/5ML syrup Take 5 mLs by mouth 4 (four) times daily as needed for cough. 10/20/20  Yes Bing Neighbors, FNP  pseudoephedrine (SUDAFED 12 HOUR) 120 MG 12 hr tablet Take 1 tablet (120 mg total) by mouth 2 (two) times daily. 10/20/20  Yes Bing Neighbors, FNP  hydrOXYzine (ATARAX/VISTARIL) 25 MG tablet 1 qhs prn sleep Patient not taking: Reported on 10/25/2018 05/24/17   Babs Sciara, MD  propranolol (INDERAL) 20 MG tablet Take 20 mg by mouth 3 (three) times daily. As needed    [provider]  sertraline (ZOLOFT) 100 MG tablet Take 1 tablet (100 mg total) by mouth daily. 10/25/18   Babs Sciara, MD    Family History Family History   Problem Relation Age of Onset   Heart disease Other    ADD / ADHD Father    ADD / ADHD Paternal Aunt    ADD / ADHD Paternal Grandfather    Alcohol abuse Paternal Grandfather     Social History Social History   Tobacco Use   Smoking status: Every Day    Packs/day: 0.50    Years: 1.00    Pack years: 0.50    Types: Cigarettes   Smokeless tobacco: Never   Tobacco comments:    States has been cutting back  Substance Use Topics   Alcohol use: No    Alcohol/week: 0.0 standard drinks    Comment: 06-09-2016 per pt yes occas.    Drug use: No    Types: Marijuana    Comment: 06-09-2016 per pt yes Marijuana     Allergies   Patient has no known allergies.   Review of Systems Review of Systems Pertinent negatives listed in HPI  Physical Exam Triage Vital Signs ED Triage Vitals  Enc Vitals Group     BP 10/20/20 1639 135/82     Pulse Rate 10/20/20 1639 93     Resp 10/20/20 1639 17     Temp 10/20/20 1639 98.4 F (36.9 C)     Temp Source 10/20/20 1639 Oral     SpO2 10/20/20 1639 96 %     Weight --  Height --      Head Circumference --      Peak Flow --      Pain Score 10/20/20 1638 0     Pain Loc --      Pain Edu? --      Excl. in GC? --    No data found.  Updated Vital Signs BP 135/82 (BP Location: Right Arm)   Pulse 93   Temp 98.4 F (36.9 C) (Oral)   Resp 17   SpO2 96%   Visual Acuity Right Eye Distance:   Left Eye Distance:   Bilateral Distance:    Right Eye Near:   Left Eye Near:    Bilateral Near:     Physical Exam  General Appearance:    Alert, cooperative, no distress  HENT:   Normocephalic, ears normal, nares mucosal edema with congestion, rhinorrhea, oropharynx  clear   Eyes:    PERRL, conjunctiva/corneas clear, EOM's intact       Lungs:     Clear to auscultation bilaterally, respirations unlabored  Heart:    Regular rate and rhythm  Neurologic:   Awake, alert, oriented x 3. No apparent focal neurological   defect.      UC Treatments /  Results  Labs (all labs ordered are listed, but only abnormal results are displayed) Labs Reviewed - No data to display  EKG   Radiology No results found.  Procedures Procedures (including critical care time)  Medications Ordered in UC Medications - No data to display  Initial Impression / Assessment and Plan / UC Course  I have reviewed the triage vital signs and the nursing notes.  Pertinent labs & imaging results that were available during my care of the patient were reviewed by me and considered in my medical decision making (see chart for details).     Viral URI with cough treatment per discharge instructions.  Given patient's symptoms COVID/flu was recommended however patient declined.  Return precautions given if symptoms worsen or do not improve. Final Clinical Impressions(s) / UC Diagnoses   Final diagnoses:  Viral URI with cough   Discharge Instructions   None    ED Prescriptions     Medication Sig Dispense Auth. Provider   levocetirizine (XYZAL) 5 MG tablet Take 1 tablet (5 mg total) by mouth every evening. 30 tablet Bing Neighbors, FNP   promethazine-dextromethorphan (PROMETHAZINE-DM) 6.25-15 MG/5ML syrup Take 5 mLs by mouth 4 (four) times daily as needed for cough. 140 mL Bing Neighbors, FNP   pseudoephedrine (SUDAFED 12 HOUR) 120 MG 12 hr tablet Take 1 tablet (120 mg total) by mouth 2 (two) times daily. 14 tablet Bing Neighbors, FNP      PDMP not reviewed this encounter.   Bing Neighbors, FNP 10/21/20 1124

## 2020-11-19 ENCOUNTER — Other Ambulatory Visit: Payer: Self-pay

## 2020-11-19 ENCOUNTER — Emergency Department (HOSPITAL_COMMUNITY)
Admission: EM | Admit: 2020-11-19 | Discharge: 2020-11-19 | Disposition: A | Discharge status: Home / Self Care | Payer: 59 | Attending: Emergency Medicine | Admitting: Emergency Medicine

## 2020-11-19 ENCOUNTER — Encounter (HOSPITAL_COMMUNITY): Payer: Self-pay | Admitting: *Deleted

## 2020-11-19 DIAGNOSIS — R002 Palpitations: Secondary | ICD-10-CM | POA: Insufficient documentation

## 2020-11-19 DIAGNOSIS — Y900 Blood alcohol level of less than 20 mg/100 ml: Secondary | ICD-10-CM | POA: Insufficient documentation

## 2020-11-19 DIAGNOSIS — F17210 Nicotine dependence, cigarettes, uncomplicated: Secondary | ICD-10-CM | POA: Insufficient documentation

## 2020-11-19 DIAGNOSIS — R202 Paresthesia of skin: Secondary | ICD-10-CM | POA: Insufficient documentation

## 2020-11-19 DIAGNOSIS — Z79899 Other long term (current) drug therapy: Secondary | ICD-10-CM | POA: Insufficient documentation

## 2020-11-19 LAB — CBC WITH DIFFERENTIAL/PLATELET
Abs Immature Granulocytes: 0.01 10*3/uL (ref 0.00–0.07)
Basophils Absolute: 0.1 10*3/uL (ref 0.0–0.1)
Basophils Relative: 1 %
Eosinophils Absolute: 0.1 10*3/uL (ref 0.0–0.5)
Eosinophils Relative: 1 %
HCT: 43.2 % (ref 39.0–52.0)
Hemoglobin: 14.9 g/dL (ref 13.0–17.0)
Immature Granulocytes: 0 %
Lymphocytes Relative: 20 %
Lymphs Abs: 1.2 10*3/uL (ref 0.7–4.0)
MCH: 33.9 pg (ref 26.0–34.0)
MCHC: 34.5 g/dL (ref 30.0–36.0)
MCV: 98.4 fL (ref 80.0–100.0)
Monocytes Absolute: 0.6 10*3/uL (ref 0.1–1.0)
Monocytes Relative: 9 %
Neutro Abs: 4.2 10*3/uL (ref 1.7–7.7)
Neutrophils Relative %: 69 %
Platelets: 200 10*3/uL (ref 150–400)
RBC: 4.39 MIL/uL (ref 4.22–5.81)
RDW: 12.2 % (ref 11.5–15.5)
WBC: 6.1 10*3/uL (ref 4.0–10.5)
nRBC: 0 % (ref 0.0–0.2)

## 2020-11-19 LAB — RAPID URINE DRUG SCREEN, HOSP PERFORMED
Amphetamines: NOT DETECTED
Barbiturates: NOT DETECTED
Benzodiazepines: NOT DETECTED
Cocaine: NOT DETECTED
Opiates: NOT DETECTED
Tetrahydrocannabinol: POSITIVE — AB

## 2020-11-19 LAB — URINALYSIS, ROUTINE W REFLEX MICROSCOPIC
Bacteria, UA: NONE SEEN
Bilirubin Urine: NEGATIVE
Glucose, UA: NEGATIVE mg/dL
Hgb urine dipstick: NEGATIVE
Ketones, ur: NEGATIVE mg/dL
Nitrite: NEGATIVE
Protein, ur: NEGATIVE mg/dL
Specific Gravity, Urine: 1.01 (ref 1.005–1.030)
pH: 6 (ref 5.0–8.0)

## 2020-11-19 LAB — ETHANOL: Alcohol, Ethyl (B): <10 mg/dL (ref <10)

## 2020-11-19 LAB — COMPREHENSIVE METABOLIC PANEL
ALT: 16 U/L (ref 0–44)
AST: 24 U/L (ref 15–41)
Albumin: 5.2 g/dL — ABNORMAL HIGH (ref 3.5–5.0)
Alkaline Phosphatase: 63 U/L (ref 38–126)
Anion gap: 12 (ref 5–15)
BUN: 6 mg/dL (ref 6–20)
CO2: 24 mmol/L (ref 22–32)
Calcium: 9.7 mg/dL (ref 8.9–10.3)
Chloride: 101 mmol/L (ref 98–111)
Creatinine, Ser: 0.87 mg/dL (ref 0.61–1.24)
GFR, Estimated: >60 mL/min (ref >60)
Glucose, Bld: 100 mg/dL — ABNORMAL HIGH (ref 70–99)
Potassium: 3.5 mmol/L (ref 3.5–5.1)
Sodium: 137 mmol/L (ref 135–145)
Total Bilirubin: 0.5 mg/dL (ref 0.3–1.2)
Total Protein: 7.9 g/dL (ref 6.5–8.1)

## 2020-11-19 MED ORDER — SERTRALINE HCL 50 MG PO TABS: 50.0000 mg | ORAL_TABLET | Freq: Every day | ORAL | 0 refills | Status: AC

## 2020-11-19 NOTE — Discharge Instructions (Addendum)
Stop taking the alpha-brain.  Avoid alcohol and THC containing products.

## 2020-11-19 NOTE — ED Provider Notes (Signed)
[facility] EMERGENCY DEPARTMENT Provider Note   CSN: [id number] Arrival date & time: 11/19/20  1344     History Chief Complaint  Patient presents with   Palpitations    Michael Gray is a 25 y.o. male.  Pt presents to the ED today with palpitations.  He was at work when it started.  Pt said he was not in a stressful situation.  He said his heart started racing and he felt numbness and tingling in his hands.  He then developed panic because he did not know what was going on.  Pt has been out of his sertraline for about 1 week.  He has been taking an OTC supplement called alpha-brain for the past few weeks.  He does drink daily; usually in the evening.  He said he feels fine now.      Past Medical History:  Diagnosis Date   ADD (attention deficit disorder)    ADHD (attention deficit hyperactivity disorder)    Allergy    Anxiety    Eczema    Generalized anxiety disorder 10/25/2018    Patient Active Problem List   Diagnosis Date Noted   Generalized anxiety disorder 10/25/2018   Substance abuse (HCC) 05/24/2017   Anxiety and depression 05/24/2017   Outbursts of anger 02/26/2016   ADD (attention deficit disorder) 12/06/2012    Past Surgical History:  Procedure Laterality Date   DENTAL SURGERY  appox 2007       Family History  Problem Relation Age of Onset   Heart disease Other    ADD / ADHD Father    ADD / ADHD Paternal Aunt    ADD / ADHD Paternal Grandfather    Alcohol abuse Paternal Grandfather     Social History   Tobacco Use   Smoking status: Every Day    Packs/day: 0.50    Years: 1.00    Pack years: 0.50    Types: Cigarettes   Smokeless tobacco: Never   Tobacco comments:    States has been cutting back  Substance Use Topics   Alcohol use: No    Alcohol/week: 0.0 standard drinks    Comment: 06-09-2016 per pt yes occas.    Drug use: No    Types: Marijuana    Comment: 06-09-2016 per pt yes Marijuana    Home Medications Prior to Admission medications    Medication Sig Start Date End Date Taking? Authorizing Provider  OVER THE COUNTER MEDICATION Take 2 capsules by mouth daily. Alpha Brain   Yes [provider]  OVER THE COUNTER MEDICATION Take 2 capsules by mouth daily. New mood   Yes [provider]  hydrOXYzine (ATARAX/VISTARIL) 25 MG tablet 1 qhs prn sleep Patient not taking: No sig reported 05/24/17   Babs Sciara, MD  levocetirizine (XYZAL) 5 MG tablet Take 1 tablet (5 mg total) by mouth every evening. Patient not taking: No sig reported 10/20/20   Bing Neighbors, FNP  promethazine-dextromethorphan (PROMETHAZINE-DM) 6.25-15 MG/5ML syrup Take 5 mLs by mouth 4 (four) times daily as needed for cough. Patient not taking: No sig reported 10/20/20   Bing Neighbors, FNP  propranolol (INDERAL) 20 MG tablet Take 20 mg by mouth 3 (three) times daily. As needed Patient not taking: No sig reported    [provider]  pseudoephedrine (SUDAFED 12 HOUR) 120 MG 12 hr tablet Take 1 tablet (120 mg total) by mouth 2 (two) times daily. Patient not taking: No sig reported 10/20/20   Bing Neighbors,  FNP  sertraline (ZOLOFT) 50 MG tablet Take 1 tablet (50 mg total) by mouth daily. 11/19/20   Jacalyn Lefevre, MD    Allergies    Patient has no known allergies.  Review of Systems   Review of Systems  Cardiovascular:  Positive for palpitations.  All other systems reviewed and are negative.  Physical Exam Updated Vital Signs BP 132/89 (BP Location: Right Arm)   Pulse 89   Temp 98.7 F (37.1 C) (Oral)   Resp 18   Ht 5\' 5"  (1.651 m)   Wt 59 kg   SpO2 98%   BMI 21.63 kg/m   Physical Exam Vitals and nursing note reviewed.  Constitutional:      Appearance: Normal appearance.  HENT:     Head: Normocephalic and atraumatic.     Right Ear: External ear normal.     Left Ear: External ear normal.     Nose: Nose normal.     Mouth/Throat:     Mouth: Mucous membranes are moist.     Pharynx: Oropharynx is clear.   Eyes:     Extraocular Movements: Extraocular movements intact.     Conjunctiva/sclera: Conjunctivae normal.     Pupils: Pupils are equal, round, and reactive to light.  Cardiovascular:     Rate and Rhythm: Normal rate and regular rhythm.     Pulses: Normal pulses.     Heart sounds: Normal heart sounds.  Pulmonary:     Effort: Pulmonary effort is normal.     Breath sounds: Normal breath sounds.  Abdominal:     General: Abdomen is flat. Bowel sounds are normal.     Palpations: Abdomen is soft.  Musculoskeletal:        General: Normal range of motion.     Cervical back: Normal range of motion and neck supple.  Skin:    General: Skin is warm.     Capillary Refill: Capillary refill takes less than 2 seconds.  Neurological:     General: No focal deficit present.     Mental Status: He is alert and oriented to person, place, and time.  Psychiatric:        Mood and Affect: Mood normal.        Behavior: Behavior normal.        Thought Content: Thought content normal.        Judgment: Judgment normal.    ED Results / Procedures / Treatments   Labs (all labs ordered are listed, but only abnormal results are displayed) Labs Reviewed  URINALYSIS, ROUTINE W REFLEX MICROSCOPIC - Abnormal; Notable for the following components:      Result Value   Leukocytes,Ua SMALL (*)    All other components within normal limits  RAPID URINE DRUG SCREEN, HOSP PERFORMED - Abnormal; Notable for the following components:   Tetrahydrocannabinol POSITIVE (*)    All other components within normal limits  COMPREHENSIVE METABOLIC PANEL - Abnormal; Notable for the following components:   Glucose, Bld 100 (*)    Albumin 5.2 (*)    All other components within normal limits  ETHANOL  CBC WITH DIFFERENTIAL/PLATELET    EKG EKG Interpretation  Date/Time:  Wednesday November 19 2020 13:58:31 EDT Ventricular Rate:  110 PR Interval:  138 QRS Duration: 96 QT Interval:  342 QTC Calculation: 462 R  Axis:   86 Text Interpretation: Sinus tachycardia Otherwise normal ECG No old tracing to compare Confirmed by [id number] [phone]) on 11/19/2020 3:21:29 PM  Radiology No results found.  Procedures  Procedures   Medications Ordered in ED Medications - No data to display  ED Course  I have reviewed the triage vital signs and the nursing notes.  Pertinent labs & imaging results that were available during my care of the patient were reviewed by me and considered in my medical decision making (see chart for details).    MDM Rules/Calculators/A&P                           Eval is unremarkable other than UDS with mj.  Pt is encouraged to avoid alcohol, mj, and to stop the alpha-brain.  He is given a refill for the zoloft.  Return if worse.  Final Clinical Impression(s) / ED Diagnoses Final diagnoses:  Palpitations    Rx / DC Orders ED Discharge Orders          Ordered    sertraline (ZOLOFT) 50 MG tablet  Daily        11/19/20 1618             Jacalyn Lefevre, MD 11/19/20 1621

## 2020-11-19 NOTE — ED Provider Notes (Addendum)
Emergency Medicine Provider Triage Evaluation Note  Michael Gray , a 25 y.o. male  was evaluated in triage.  Pt complains of sudden onset of racing of his heart, numbness tingling to both hands, and hearing his "heartbeat in my head."  Symptoms began approximately 1 hour prior to arrival.  Patient reports feeling slightly better since arrival here.  Reports history of anxiety, but denies any recent increase stressors.  He does state that he has been out of his 50 mg sertraline for nearly 1 week.  He also states that he drinks on average eight to ten 12 ounce beers per day and began taking an over-the-counter herbal supplement for improved memory and focus.  Last drank alcohol last night.  He denies any illicit drug use.  No history of heart disease.  He denies cough, fever, chills, shortness of breath visual change or neck pain or stiffness.  Review of Systems  Positive: Palpitations, numbness tingling of both hands Negative: Shortness of breath, fever, cough, visual change  Physical Exam  BP (!) 127/103   Pulse 93   Temp 98.5 F (36.9 C) (Oral)   Resp 20   Ht 5\' 5"  (1.651 m)   Wt 59 kg   SpO2 100%   BMI 21.63 kg/m  Gen:   Awake, no distress, appears anxious slightly tremulous Resp:  Normal effort lungs clear to auscultation bilaterally MSK:   Moves extremities without difficulty, ambulatory, gait steady Other:    Medical Decision Making  Medically screening exam initiated at 2:30 PM.  Appropriate orders placed.  ROBIN PAFFORD was informed that the remainder of the evaluation will be completed by another provider, this initial triage assessment does not replace that evaluation, and the importance of remaining in the ED until their evaluation is complete.  Patient here for evaluation of sudden onset of palpitations, numbness and tingling to both hands, and feels "his heart beating in my head."  On exam, patient appears very anxious and tremulous.  No focal neurodeficits.  He will need  further evaluation in the emergency department.  Patient is agreeable to plan.     Heath Lark, PA-C 11/19/20 1436    11/21/20, PA-C 11/19/20 1437    11/21/20, MD 11/20/20 [phone]

## 2020-11-19 NOTE — ED Triage Notes (Signed)
Pt states he was at work when he "started to feel funny" pt states he could feel his heartbeat in his chest and started having numbness and tingling to both hands; pt denies any pain

## 2020-11-20 ENCOUNTER — Telehealth: Payer: Self-pay | Admitting: Family Medicine

## 2020-11-20 NOTE — Telephone Encounter (Signed)
So if the patient needs to follow-up in 2 days that would be either today or tomorrow. That would be up to Dr. Ladona Ridgel  If Dr. Ladona Ridgel does not have availability you will need to explain to the patient currently the shortage of slots due to lack of providers(most notably no longer having a nurse practitioner on a daily basis) May be given an appointment slot for next week-but even those are booked up.(Maximum I will do on any given day is 20)

## 2020-11-20 NOTE — Telephone Encounter (Signed)
Please advise. Thank you

## 2020-11-20 NOTE — Telephone Encounter (Signed)
Patient was seen in ER on 7/20 for palpitations ans was told to follow up primary care in two days. Please advise where to put on schedule

## 2021-05-21 ENCOUNTER — Ambulatory Visit: Payer: 59 | Admitting: Nurse Practitioner

## 2021-06-29 ENCOUNTER — Ambulatory Visit: Payer: 59 | Admitting: Nurse Practitioner

## 2021-11-10 ENCOUNTER — Ambulatory Visit
Admission: EM | Admit: 2021-11-10 | Discharge: 2021-11-10 | Disposition: A | Discharge status: Home / Self Care | Payer: 59

## 2021-11-10 ENCOUNTER — Ambulatory Visit (INDEPENDENT_AMBULATORY_CARE_PROVIDER_SITE_OTHER): Payer: 59

## 2021-11-10 DIAGNOSIS — M542 Cervicalgia: Secondary | ICD-10-CM

## 2021-11-10 MED ORDER — CYCLOBENZAPRINE HCL 5 MG PO TABS: 5.0000 mg | ORAL_TABLET | Freq: Every evening | ORAL | 0 refills | Status: AC | PRN

## 2021-11-10 MED ORDER — IBUPROFEN 800 MG PO TABS: 800.0000 mg | ORAL_TABLET | Freq: Three times a day (TID) | ORAL | 0 refills | Status: AC | PRN

## 2021-11-10 NOTE — ED Provider Notes (Signed)
[facility]    CSN: [id number] Arrival date & time: 11/10/21  1424      History   Chief Complaint Chief Complaint  Patient presents with   Neck Pain    HPI Michael Gray is a 26 y.o. male.   Patient presents with ongoing neck pain for the past 3 days.  Reports over the weekend, he went down an inflatable water slide on his stomach, had first and "rammed" into the end of it.  Reports he felt a "electric jolt" all over his body immediately after and had neck pain immediately after.  Reports initially, his right arm was numb, however this resolved.  Currently the pain in his neck is mostly on the right side and radiates down into his right shoulder blade.  He denies any numbness or tingling in his fingertips currently.  Denies any weakness, fevers, nausea/vomiting since it happened.  He is taking ibuprofen which helps minimally.    Past Medical History:  Diagnosis Date   ADD (attention deficit disorder)    ADHD (attention deficit hyperactivity disorder)    Allergy    Anxiety    Eczema    Generalized anxiety disorder 10/25/2018    Patient Active Problem List   Diagnosis Date Noted   Generalized anxiety disorder 10/25/2018   Substance abuse (HCC) 05/24/2017   Anxiety and depression 05/24/2017   Outbursts of anger 02/26/2016   ADD (attention deficit disorder) 12/06/2012    Past Surgical History:  Procedure Laterality Date   DENTAL SURGERY  appox 2007       Home Medications    Prior to Admission medications   Medication Sig Start Date End Date Taking? Authorizing Provider  cyclobenzaprine (FLEXERIL) 5 MG tablet Take 1 tablet (5 mg total) by mouth at bedtime as needed for muscle spasms. Do not take with alcohol or while driving or operating heavy machinery 11/10/21  Yes Cathlean Marseilles A, NP  hydrOXYzine (ATARAX/VISTARIL) 25 MG tablet 1 qhs prn sleep Patient not taking: Reported on 10/25/2018 05/24/17   Babs Sciara, MD  ibuprofen (ADVIL) 800 MG tablet  Take 1 tablet (800 mg total) by mouth every 8 (eight) hours as needed for moderate pain. 11/10/21   Valentino Nose, NP  levocetirizine (XYZAL) 5 MG tablet Take 1 tablet (5 mg total) by mouth every evening. Patient not taking: Reported on 11/19/2020 10/20/20   Bing Neighbors, FNP  OVER THE COUNTER MEDICATION Take 2 capsules by mouth daily. Alpha Brain    [provider]  OVER THE COUNTER MEDICATION Take 2 capsules by mouth daily. New mood    [provider]  propranolol (INDERAL) 20 MG tablet Take 20 mg by mouth 3 (three) times daily. As needed Patient not taking: Reported on 11/19/2020    [provider]  sertraline (ZOLOFT) 50 MG tablet Take 1 tablet (50 mg total) by mouth daily. 11/19/20   Jacalyn Lefevre, MD    Family History Family History  Problem Relation Age of Onset   Heart disease Other    ADD / ADHD Father    ADD / ADHD Paternal Aunt    ADD / ADHD Paternal Grandfather    Alcohol abuse Paternal Grandfather     Social History Social History   Tobacco Use   Smoking status: Every Day    Packs/day: 0.50    Years: 1.00    Total pack years: 0.50    Types: Cigarettes   Smokeless tobacco: Never   Tobacco comments:  States has been cutting back  Substance Use Topics   Alcohol use: No    Alcohol/week: 0.0 standard drinks of alcohol    Comment: 06-09-2016 per pt yes occas.    Drug use: No    Types: Marijuana    Comment: 06-09-2016 per pt yes Marijuana     Allergies   Patient has no known allergies.   Review of Systems Review of Systems Per HPI  Physical Exam Triage Vital Signs ED Triage Vitals  Enc Vitals Group     BP 11/10/21 1448 118/77     Pulse Rate 11/10/21 1448 64     Resp 11/10/21 1448 16     Temp 11/10/21 1448 99.1 F (37.3 C)     Temp Source 11/10/21 1448 Oral     SpO2 11/10/21 1448 98 %     Weight --      Height --      Head Circumference --      Peak Flow --      Pain Score 11/10/21 1447 8     Pain Loc --       Pain Edu? --      Excl. in GC? --    No data found.  Updated Vital Signs BP 118/77 (BP Location: Right Arm)   Pulse 64   Temp 99.1 F (37.3 C) (Oral)   Resp 16   SpO2 98%   Visual Acuity Right Eye Distance:   Left Eye Distance:   Bilateral Distance:    Right Eye Near:   Left Eye Near:    Bilateral Near:     Physical Exam Vitals and nursing note reviewed.  Constitutional:      General: He is not in acute distress.    Appearance: Normal appearance. He is not toxic-appearing.  HENT:     Mouth/Throat:     Mouth: Mucous membranes are moist.     Pharynx: Oropharynx is clear.  Eyes:     General: No scleral icterus.    Extraocular Movements: Extraocular movements intact.     Pupils: Pupils are equal, round, and reactive to light.  Neck:      Comments: Nontender to percussion of cervical spinous process Pulmonary:     Effort: Pulmonary effort is normal. No respiratory distress.  Musculoskeletal:       Arms:     Cervical back: Normal range of motion. Tenderness present. No erythema, signs of trauma or rigidity. Pain with movement and muscular tenderness present. No spinous process tenderness. Normal range of motion.     Comments: Inspection: No swelling, obvious deformity, or redness to neck or upper back  Palpation: Tender to palpation in the areas marked; no obvious deformities palpated  ROM: Full ROM to neck and right upper extremity Strength: 5/5 bilateral upper extremities, neck Neurovascular: neurovascularly intact in left and right upper extremity   Skin:    Capillary Refill: Capillary refill takes less than 2 seconds.  Neurological:     Mental Status: He is alert and oriented to person, place, and time.     Cranial Nerves: Cranial nerves 2-12 are intact.     Sensory: Sensation is intact.     Motor: No weakness.     Coordination: Coordination normal. Finger-Nose-Finger Test normal. Rapid alternating movements normal.     Gait: Gait normal.  Psychiatric:         Behavior: Behavior is cooperative.      UC Treatments / Results  Labs (all labs ordered are listed, but  only abnormal results are displayed) Labs Reviewed - No data to display  EKG   Radiology DG Cervical Spine Complete  Result Date: 11/10/2021 CLINICAL DATA:  Neck pain EXAM: CERVICAL SPINE - COMPLETE 4+ VIEW COMPARISON:  04/15/2015 FINDINGS: There is no evidence of cervical spine fracture or prevertebral soft tissue swelling. Alignment is normal. Intervertebral disc heights are preserved. Oblique views demonstrate widely patent bony foramina bilaterally. No other significant bone abnormalities are identified. IMPRESSION: Negative cervical spine radiographs. Electronically Signed   By: Duanne Guess D.O.   On: 11/10/2021 15:21    Procedures Procedures (including critical care time)  Medications Ordered in UC Medications - No data to display  Initial Impression / Assessment and Plan / UC Course  I have reviewed the triage vital signs and the nursing notes.  Pertinent labs & imaging results that were available during my care of the patient were reviewed by me and considered in my medical decision making (see chart for details).    Patient is a pleasant, well-appearing 26 year old male presenting for neck pain.  On exam, patient has significant movement and muscular pain.  Nontender to percussion of cervical spine.  Cervical x-ray today does not show any acute bony abnormality.  Treat muscular pain with Tylenol alternate with ibuprofen, warm/cool compresses.  Cyclobenzaprine 5 mg at nighttime as needed to help with muscular pain.  Note given for work.  Seek care if symptoms persist despite treatment.  Final Clinical Impressions(s) / UC Diagnoses   Final diagnoses:  Neck pain     Discharge Instructions      - Neck x-ray does not show any fractures in your spine - The pain you are having is likely muscular - You can continue ibuprofen 800 mg every 8 hours as needed along  with Tylenol [phone] mg every 6 hours as needed for pain.  If these are not beneficial, you can use cyclobenzaprine 5 mg at night time as needed - Start warm/cool compresses 15 minutes on, 45 minutes off     ED Prescriptions     Medication Sig Dispense Auth. Provider   ibuprofen (ADVIL) 800 MG tablet Take 1 tablet (800 mg total) by mouth every 8 (eight) hours as needed for moderate pain. 30 tablet Cathlean Marseilles A, NP   cyclobenzaprine (FLEXERIL) 5 MG tablet Take 1 tablet (5 mg total) by mouth at bedtime as needed for muscle spasms. Do not take with alcohol or while driving or operating heavy machinery 30 tablet Valentino Nose, NP      PDMP not reviewed this encounter.   Valentino Nose, NP 11/10/21 1535

## 2021-11-10 NOTE — Discharge Instructions (Addendum)
-   Neck x-ray does not show any fractures in your spine - The pain you are having is likely muscular - You can continue ibuprofen 800 mg every 8 hours as needed along with Tylenol [phone] mg every 6 hours as needed for pain.  If these are not beneficial, you can use cyclobenzaprine 5 mg at night time as needed - Start warm/cool compresses 15 minutes on, 45 minutes off

## 2021-11-10 NOTE — ED Triage Notes (Signed)
Pt reports right sided neck pain x 3 days. Reports pain started after he was in an inflatable slider. Ibuprofen gives some relief.

## 2022-12-07 NOTE — Progress Notes (Signed)
This encounter was created in error - please disregard.
